# Patient Record
Sex: Male | Born: 1960 | Race: White | Hispanic: No | Marital: Married | Smoking: Current every day smoker
Health system: Southern US, Community
[De-identification: ages and names within clinical notes are randomized; demographics above are authoritative.]

## PROBLEM LIST (undated history)

## (undated) DIAGNOSIS — C4490 Unspecified malignant neoplasm of skin, unspecified: Secondary | ICD-10-CM

## (undated) DIAGNOSIS — J449 Chronic obstructive pulmonary disease, unspecified: Secondary | ICD-10-CM

## (undated) DIAGNOSIS — K449 Diaphragmatic hernia without obstruction or gangrene: Secondary | ICD-10-CM

## (undated) HISTORY — PX: ROTATOR CUFF REPAIR: SHX139

## (undated) HISTORY — PX: APPENDECTOMY: SHX54

## (undated) HISTORY — PX: KNEE ARTHROSCOPY: SUR90

## (undated) HISTORY — PX: SKIN CANCER EXCISION: SHX779

---

## 2011-02-19 ENCOUNTER — Ambulatory Visit: Payer: Self-pay

## 2012-01-13 ENCOUNTER — Encounter (HOSPITAL_COMMUNITY): Payer: Self-pay | Admitting: Emergency Medicine

## 2012-01-13 ENCOUNTER — Emergency Department (HOSPITAL_COMMUNITY): Payer: Medicare Other

## 2012-01-13 ENCOUNTER — Observation Stay (HOSPITAL_COMMUNITY)
Admission: EM | Admit: 2012-01-13 | Discharge: 2012-01-14 | Disposition: A | Discharge status: Home / Self Care | Payer: Medicare Other | Attending: Emergency Medicine | Admitting: Emergency Medicine

## 2012-01-13 DIAGNOSIS — F17200 Nicotine dependence, unspecified, uncomplicated: Secondary | ICD-10-CM | POA: Insufficient documentation

## 2012-01-13 DIAGNOSIS — R911 Solitary pulmonary nodule: Secondary | ICD-10-CM

## 2012-01-13 DIAGNOSIS — R42 Dizziness and giddiness: Secondary | ICD-10-CM | POA: Insufficient documentation

## 2012-01-13 DIAGNOSIS — J4489 Other specified chronic obstructive pulmonary disease: Secondary | ICD-10-CM | POA: Insufficient documentation

## 2012-01-13 DIAGNOSIS — K089 Disorder of teeth and supporting structures, unspecified: Secondary | ICD-10-CM | POA: Insufficient documentation

## 2012-01-13 DIAGNOSIS — R0602 Shortness of breath: Secondary | ICD-10-CM | POA: Insufficient documentation

## 2012-01-13 DIAGNOSIS — K0889 Other specified disorders of teeth and supporting structures: Secondary | ICD-10-CM

## 2012-01-13 DIAGNOSIS — J449 Chronic obstructive pulmonary disease, unspecified: Secondary | ICD-10-CM

## 2012-01-13 DIAGNOSIS — R112 Nausea with vomiting, unspecified: Secondary | ICD-10-CM | POA: Insufficient documentation

## 2012-01-13 DIAGNOSIS — R079 Chest pain, unspecified: Principal | ICD-10-CM

## 2012-01-13 DIAGNOSIS — R61 Generalized hyperhidrosis: Secondary | ICD-10-CM | POA: Insufficient documentation

## 2012-01-13 HISTORY — DX: Unspecified malignant neoplasm of skin, unspecified: C44.90

## 2012-01-13 HISTORY — DX: Diaphragmatic hernia without obstruction or gangrene: K44.9

## 2012-01-13 HISTORY — DX: Chronic obstructive pulmonary disease, unspecified: J44.9

## 2012-01-13 LAB — CBC
Hemoglobin: 17.1 g/dL — ABNORMAL HIGH (ref 13.0–17.0)
MCH: 31.1 pg (ref 26.0–34.0)
MCV: 88 fL (ref 78.0–100.0)
Platelets: 250 10*3/uL (ref 150–400)
RBC: 5.49 MIL/uL (ref 4.22–5.81)
WBC: 18 10*3/uL — ABNORMAL HIGH (ref 4.0–10.5)

## 2012-01-13 LAB — POCT I-STAT TROPONIN I
Troponin i, poc: 0 ng/mL (ref 0.00–0.08)
Troponin i, poc: 0 ng/mL (ref 0.00–0.08)

## 2012-01-13 LAB — BASIC METABOLIC PANEL
CO2: 23 mEq/L (ref 19–32)
Calcium: 9.2 mg/dL (ref 8.4–10.5)
Chloride: 103 mEq/L (ref 96–112)
Creatinine, Ser: 0.76 mg/dL (ref 0.50–1.35)
Glucose, Bld: 92 mg/dL (ref 70–99)
Sodium: 136 mEq/L (ref 135–145)

## 2012-01-13 MED ORDER — NITROGLYCERIN 0.4 MG SL SUBL
0.4000 mg | SUBLINGUAL_TABLET | SUBLINGUAL | Status: DC | PRN
  Administered 2012-01-13: 0.4 mg via SUBLINGUAL

## 2012-01-13 MED ORDER — DIAZEPAM 5 MG PO TABS: 5.0000 mg | ORAL_TABLET | Freq: Every evening | ORAL | Status: DC | PRN

## 2012-01-13 MED ORDER — PENICILLIN V POTASSIUM 250 MG PO TABS
250.0000 mg | ORAL_TABLET | Freq: Once | ORAL | Status: AC
  Administered 2012-01-13: 250 mg via ORAL
  Filled 2012-01-13: qty 1

## 2012-01-13 MED ORDER — OXYCODONE-ACETAMINOPHEN 5-325 MG PO TABS
ORAL_TABLET | ORAL | Status: AC
  Filled 2012-01-13: qty 1

## 2012-01-13 MED ORDER — OXYCODONE-ACETAMINOPHEN 5-325 MG PO TABS
1.0000 | ORAL_TABLET | Freq: Once | ORAL | Status: AC
  Administered 2012-01-13: 1 via ORAL

## 2012-01-13 MED ORDER — ALBUTEROL SULFATE HFA 108 (90 BASE) MCG/ACT IN AERS: 2.0000 | INHALATION_SPRAY | Freq: Four times a day (QID) | RESPIRATORY_TRACT | Status: DC | PRN

## 2012-01-13 MED ORDER — OXYCODONE-ACETAMINOPHEN 5-325 MG PO TABS
1.0000 | ORAL_TABLET | Freq: Once | ORAL | Status: AC
  Administered 2012-01-13: 1 via ORAL
  Filled 2012-01-13: qty 1

## 2012-01-13 MED ORDER — ACETAMINOPHEN 325 MG PO TABS
650.0000 mg | ORAL_TABLET | Freq: Once | ORAL | Status: AC
  Administered 2012-01-13: 650 mg via ORAL
  Filled 2012-01-13: qty 2

## 2012-01-13 MED ORDER — CYCLOBENZAPRINE HCL 10 MG PO TABS: 10.0000 mg | ORAL_TABLET | Freq: Every evening | ORAL | Status: DC | PRN

## 2012-01-13 MED ORDER — ASPIRIN 81 MG PO CHEW
324.0000 mg | CHEWABLE_TABLET | Freq: Once | ORAL | Status: AC
  Administered 2012-01-13: 324 mg via ORAL
  Filled 2012-01-13: qty 4

## 2012-01-13 MED ORDER — PANTOPRAZOLE SODIUM 40 MG PO TBEC
40.0000 mg | DELAYED_RELEASE_TABLET | Freq: Two times a day (BID) | ORAL | Status: DC
  Administered 2012-01-13: 40 mg via ORAL
  Filled 2012-01-13: qty 1

## 2012-01-13 NOTE — ED Provider Notes (Signed)
Medical screening examination/treatment/procedure(s) were performed by non-physician practitioner and as supervising physician I was immediately available for consultation/collaboration.  Ethelda Chick, MD 01/13/12 1630

## 2012-01-13 NOTE — ED Provider Notes (Signed)
History     CSN: [id number]  Arrival date & time 01/13/12  1304   First MD Initiated Contact with Patient 01/13/12 1332      Chief Complaint  Patient presents with  . Chest Pain  . Dental Pain    (Consider location/radiation/quality/duration/timing/severity/associated sxs/prior treatment) HPI  Pt presents to the ED because of on again off again for the past 3 days. He has been dealing with chest pains for two years and had a stress test done in October of 2012 which the patient states was normal. He continues to have pressure and it relieved with nitro. The patient has risk factors such as smoking and COPD. He endorses feeling shortness of breath, N/V, diaphoresis and dizziness with the pain that has been waxing and wain ing. He also complains of dental pain for which he is taking pain medications for at home already. He states that the medications are not working. The patient is in NAD and VSS at this time.  Filed Vitals:   01/13/12 1539  BP: 122/90  Pulse: 56  Temp:   Resp: 14     Past Medical History  Diagnosis Date  . Skin cancer   . COPD (chronic obstructive pulmonary disease)   . Hiatal hernia     Past Surgical History  Procedure Date  . Skin cancer excision     History reviewed. No pertinent family history.  History  Substance Use Topics  . Smoking status: Current Everyday Smoker  . Smokeless tobacco: Not on file  . Alcohol Use: Yes     occasional      Review of Systems   HEENT: denies blurry vision or change in hearing PULMONARY: Denies difficulty breathing and SOB CARDIAC: denies heart palpitations MUSCULOSKELETAL:  denies being unable to ambulate ABDOMEN AL: denies abdominal pain GU: denies loss of bowel or urinary control NEURO: denies numbness and tingling in extremities SKIN: no new rashes PSYCH: patient denies anxiety or depression. NECK: Pt denies having neck pain     Allergies  Morphine and related  Home Medications   Current  Outpatient Rx  Name Route Sig Dispense Refill  . ALBUTEROL SULFATE HFA 108 (90 BASE) MCG/ACT IN AERS Inhalation Inhale 2 puffs into the lungs every 6 (six) hours as needed. For shortness of breath    . BECLOMETHASONE DIPROPIONATE 80 MCG/ACT IN AERS Inhalation Inhale 1 puff into the lungs 2 (two) times daily.    . CYCLOBENZAPRINE HCL 10 MG PO TABS Oral Take 10 mg by mouth at bedtime as needed. For pain    . DIAZEPAM 5 MG PO TABS Oral Take 5 mg by mouth at bedtime as needed. For sleep    . HYDROCODONE-ACETAMINOPHEN 10-500 MG PO TABS Oral Take 1 tablet by mouth 3 (three) times daily as needed. For pain    . PANTOPRAZOLE SODIUM 40 MG PO TBEC Oral Take 40 mg by mouth 2 (two) times daily.      BP 112/81  Pulse 53  Temp 98.3 F (36.8 C) (Oral)  Resp 20  SpO2 98%  Physical Exam  Nursing note and vitals reviewed. Constitutional: He appears well-developed and well-nourished. No distress.  HENT:  Head: Normocephalic and atraumatic.  Mouth/Throat: Dental caries present.  Eyes: Conjunctivae and EOM are normal. Pupils are equal, round, and reactive to light.  Neck: Normal range of motion. Neck supple.  Cardiovascular: Normal rate and regular rhythm.   Pulmonary/Chest: Effort normal and breath sounds normal. He has no wheezes. He has no rales.  Abdominal: Soft.  Neurological: He is alert.  Skin: Skin is warm and dry.    ED Course  Procedures (including critical care time)  Labs Reviewed  CBC - Abnormal; Notable for the following:    WBC 18.0 (*)     Hemoglobin 17.1 (*)     All other components within normal limits  BASIC METABOLIC PANEL  POCT I-STAT TROPONIN I   Dg Chest 2 View  01/13/2012  *RADIOLOGY REPORT*  Clinical Data: Shortness of breath.  Episode of mid chest pain approximately 2 weeks ago.  Smoker.  CHEST - 2 VIEW  Comparison: Two-view chest x-ray 11/12/2006 [facility].  Findings: Cardiomediastinal silhouette unremarkable, unchanged. Mildly prominent bronchovascular  markings diffusely, unchanged. Lungs otherwise clear.  No pleural effusions.  Mild degenerative changes involving the thoracic spine.  IMPRESSION: Stable mild changes of chronic bronchitis and/or asthma.  No acute cardiopulmonary disease.  Original Report Authenticated By: Arnell Sieving, M.D.     No diagnosis found.    MDM   Date: 01/13/2012  Rate: 65  Rhythm: normal sinus rhythm  QRS Axis: normal  Intervals: normal  ST/T Wave abnormalities: normal  Conduction Disutrbances:right bundle branch block  Narrative Interpretation:   Old EKG Reviewed: none available  First troponin negative. Pt states that he had normal stress test at [facility] approx 1 year ago. Per results from [facility] hill that I had faxed over shows that his test was abnormal due to being incomplete   Due to patients continuing pain over the past 2 years and abnormal stress test. Dr. Karma Ganja and I feel the patient is a good candidate for the chest pain protocol.  I have discussed patient with Grant Fontana, PA-C who is working in the CDU and I have spoken with patients family who are all agreeable to the plan.         Dorthula Matas, PA 01/13/12 [phone]

## 2012-01-13 NOTE — ED Notes (Signed)
bmi 24.2

## 2012-01-13 NOTE — ED Provider Notes (Signed)
4:00 PM Care assumed of patient in the CDU on the chest pain protocol (CT). Patient presented with complaint of intermittent midsternal chest pain since Sunday which was accompanied by shortness of breath, nausea and vomiting, diaphoresis, and dizziness. He is currently chest pain-free. He had a stress test at [facility] which was "abnormal" but he has had no further workup for this. The stress test was several months ago.  Patient is also complaining of dental pain. He appears to have a cavity to the right upper premolar. No obvious evidence of dental abscess. No trismus. He will need to be referred to dentistry on discharge. He was given Percocet.  9:30 PM Pt rechecked - remains pain free - no complaints at this time.  12:00 AM Pt sleeping. Pt signed out to Dr. Oletta Lamas, Pod B overnight MD.  Grant Fontana, PA-C 01/14/12 0001

## 2012-01-13 NOTE — ED Notes (Signed)
T. Neva Seat [facility] made aware that the patient is complaining of a headache after 1 NTG SL.

## 2012-01-13 NOTE — ED Notes (Signed)
Pt c/o midsternal chest pain onset Sunday with shortness of breath, N/v, diaphoresis and dizziness. Pt also reports left fingers feeling numb. Pt c/o right upper toothache onset June 16th.

## 2012-01-14 ENCOUNTER — Other Ambulatory Visit (HOSPITAL_COMMUNITY): Payer: Self-pay

## 2012-01-14 ENCOUNTER — Observation Stay (HOSPITAL_COMMUNITY): Payer: Medicare Other

## 2012-01-14 MED ORDER — PENICILLIN V POTASSIUM 500 MG PO TABS: 500.0000 mg | ORAL_TABLET | Freq: Three times a day (TID) | ORAL | Status: DC

## 2012-01-14 MED ORDER — PENICILLIN V POTASSIUM 250 MG PO TABS
250.0000 mg | ORAL_TABLET | Freq: Once | ORAL | Status: AC
  Administered 2012-01-14: 250 mg via ORAL
  Filled 2012-01-14: qty 1

## 2012-01-14 MED ORDER — HYDROCODONE-ACETAMINOPHEN 5-500 MG PO TABS: 1.0000 | ORAL_TABLET | Freq: Four times a day (QID) | ORAL | Status: AC | PRN

## 2012-01-14 MED ORDER — NITROGLYCERIN 0.4 MG SL SUBL
SUBLINGUAL_TABLET | SUBLINGUAL | Status: AC
  Administered 2012-01-14: 0.4 mg via SUBLINGUAL
  Filled 2012-01-14: qty 25

## 2012-01-14 MED ORDER — NITROGLYCERIN 0.4 MG SL SUBL
0.4000 mg | SUBLINGUAL_TABLET | Freq: Once | SUBLINGUAL | Status: AC
  Administered 2012-01-14: 0.4 mg via SUBLINGUAL

## 2012-01-14 MED ORDER — PENICILLIN V POTASSIUM 500 MG PO TABS: 500.0000 mg | ORAL_TABLET | Freq: Three times a day (TID) | ORAL | Status: AC

## 2012-01-14 MED ORDER — METOPROLOL TARTRATE 1 MG/ML IV SOLN
INTRAVENOUS | Status: AC
  Filled 2012-01-14: qty 5

## 2012-01-14 MED ORDER — METOPROLOL TARTRATE 1 MG/ML IV SOLN: 5.0000 mg | Freq: Once | INTRAVENOUS | Status: DC

## 2012-01-14 MED ORDER — HYDROCODONE-ACETAMINOPHEN 5-500 MG PO TABS: 1.0000 | ORAL_TABLET | Freq: Four times a day (QID) | ORAL | Status: DC | PRN

## 2012-01-14 MED ORDER — IOHEXOL 350 MG/ML SOLN
80.0000 mL | Freq: Once | INTRAVENOUS | Status: AC | PRN
  Administered 2012-01-14: 80 mL via INTRAVENOUS

## 2012-01-14 MED ORDER — METOPROLOL TARTRATE 1 MG/ML IV SOLN
5.0000 mg | Freq: Once | INTRAVENOUS | Status: AC
  Administered 2012-01-14: 5 mg via INTRAVENOUS
  Filled 2012-01-14: qty 5

## 2012-01-14 MED ORDER — OXYCODONE-ACETAMINOPHEN 5-325 MG PO TABS
1.0000 | ORAL_TABLET | Freq: Once | ORAL | Status: AC
  Administered 2012-01-14: 1 via ORAL
  Filled 2012-01-14: qty 1

## 2012-01-14 NOTE — ED Provider Notes (Addendum)
Patient on CP protocol placed by Dr. Oletta Lamas overnight. Continues to deny CP and waiting coronary CT this morning. Due to leave CDU soon for procedure. Patient complaining of ongoing mild to moderate dental pain associated with dental cary. Will treat dental pain. Afebrile and no facial swelling. dispo pending imaging.   Drucie Opitz, [place] 01/14/12 4098  Patient has a PCP in [place] to follow up with to have OP CT chest non contrast scheduled in 6 months for f/u of pulmonary nodules. No cardiac origin of chest pain with calcium score 0 and no evidence of CAD.   [place], [place] 01/14/12 [phone]

## 2012-01-14 NOTE — Progress Notes (Signed)
Observation review is complete. 

## 2012-01-14 NOTE — ED Notes (Signed)
Patient sitting up in bed. Appears to be in pain. When questioned states his face is hurting. Patient offered pain meds. States he is alright. Advised of need to inform staff of need for pain medication. Will continue to monitor patient.

## 2012-01-14 NOTE — ED Provider Notes (Signed)
Medical screening examination/treatment/procedure(s) were performed by non-physician practitioner and as supervising physician I was immediately available for consultation/collaboration.  Cheri Guppy, MD 01/14/12 [phone]

## 2012-01-14 NOTE — ED Provider Notes (Signed)
Medical screening examination/treatment/procedure(s) were performed by non-physician practitioner and as supervising physician I was immediately available for consultation/collaboration.  Kyrstyn Greear, MD 01/14/12 1127 

## 2012-01-16 NOTE — ED Provider Notes (Signed)
Medical screening examination/treatment/procedure(s) were performed by non-physician practitioner and as supervising physician I was immediately available for consultation/collaboration.  Ethelda Chick, MD 01/16/12 [phone]

## 2014-01-07 IMAGING — CR DG CHEST 2V
2 series · 2 of 2 positions shown · non-contrast
Comparison: Two-view chest x-ray 11/12/2006 [facility].

CLINICAL DATA: Shortness of breath.  Episode of mid chest pain
approximately 2 weeks ago.  Smoker.

CHEST - 2 VIEW

[w chest pa]
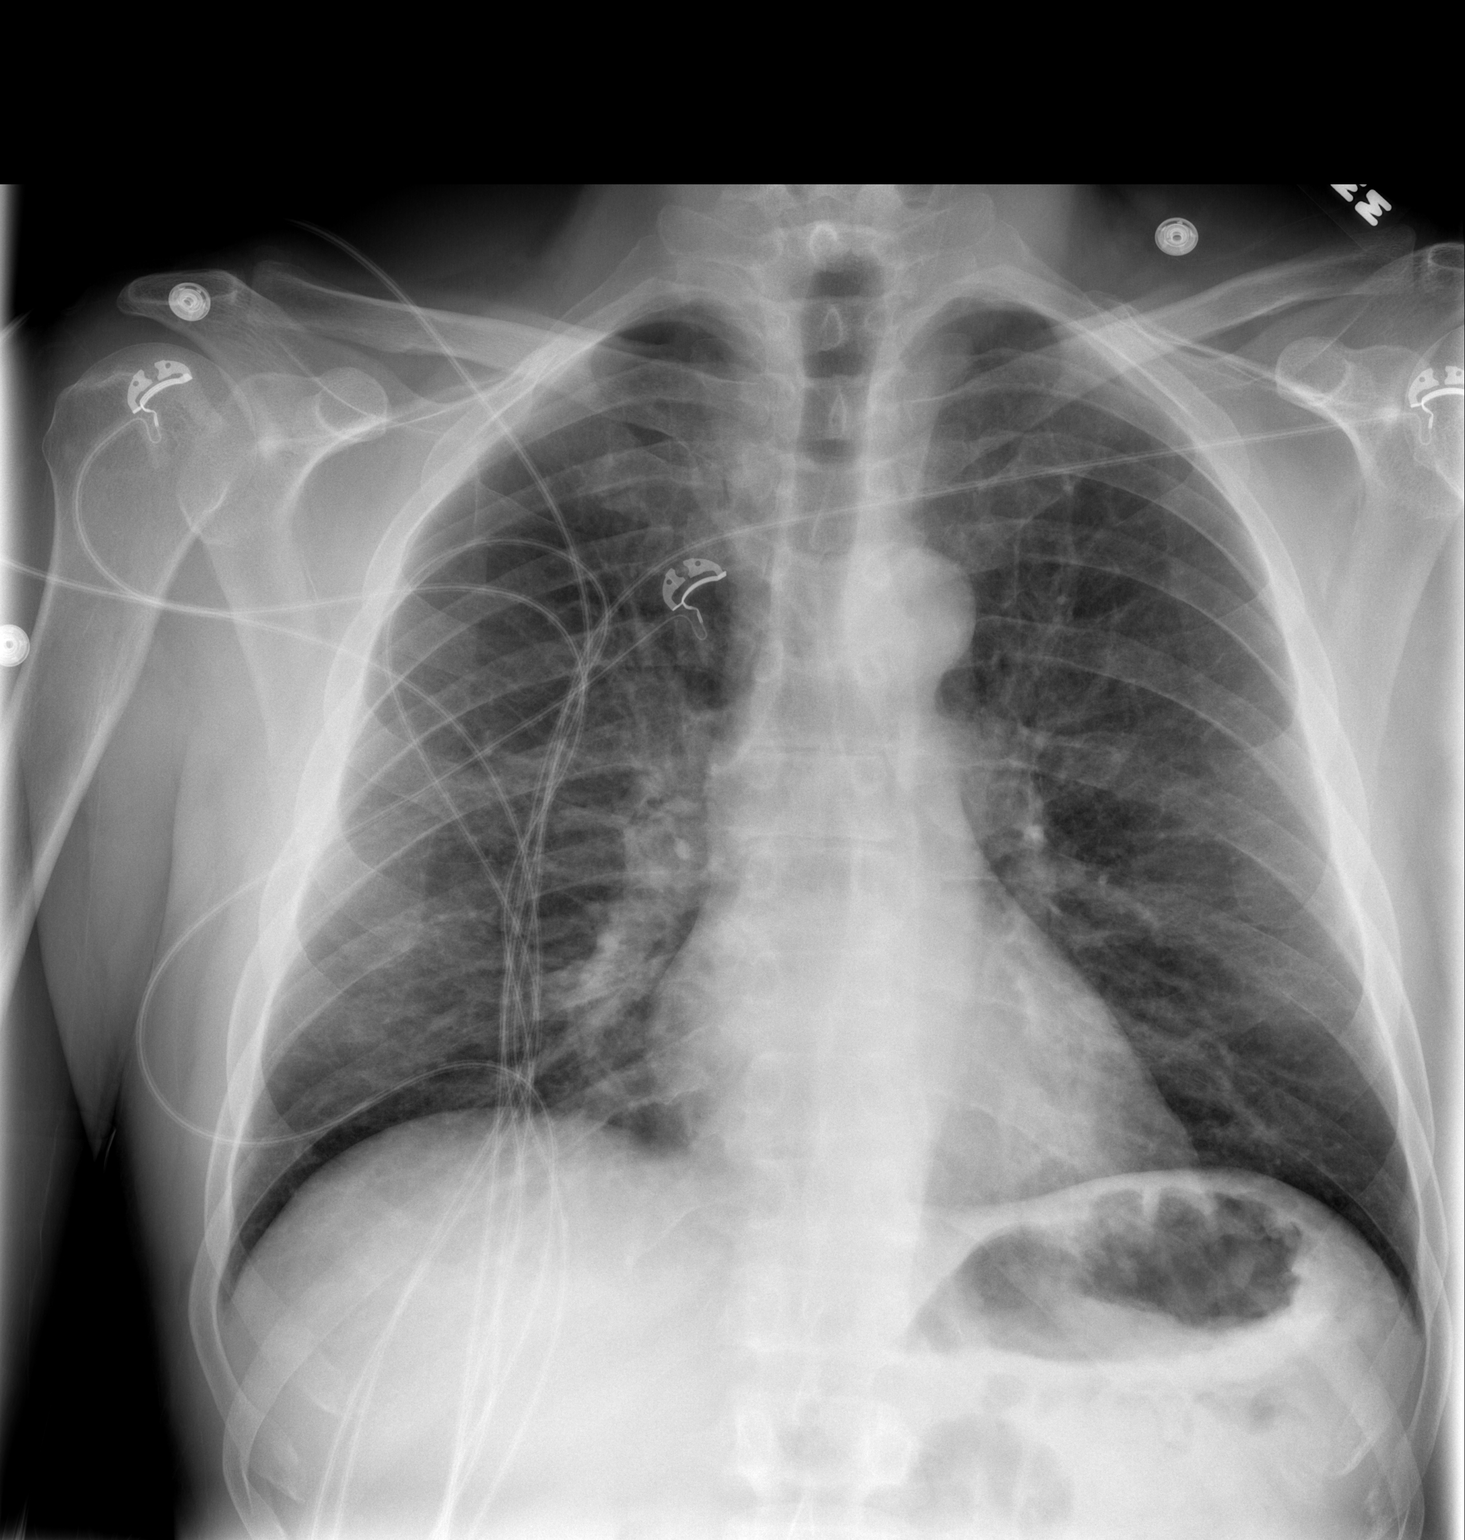

[w chest lat]
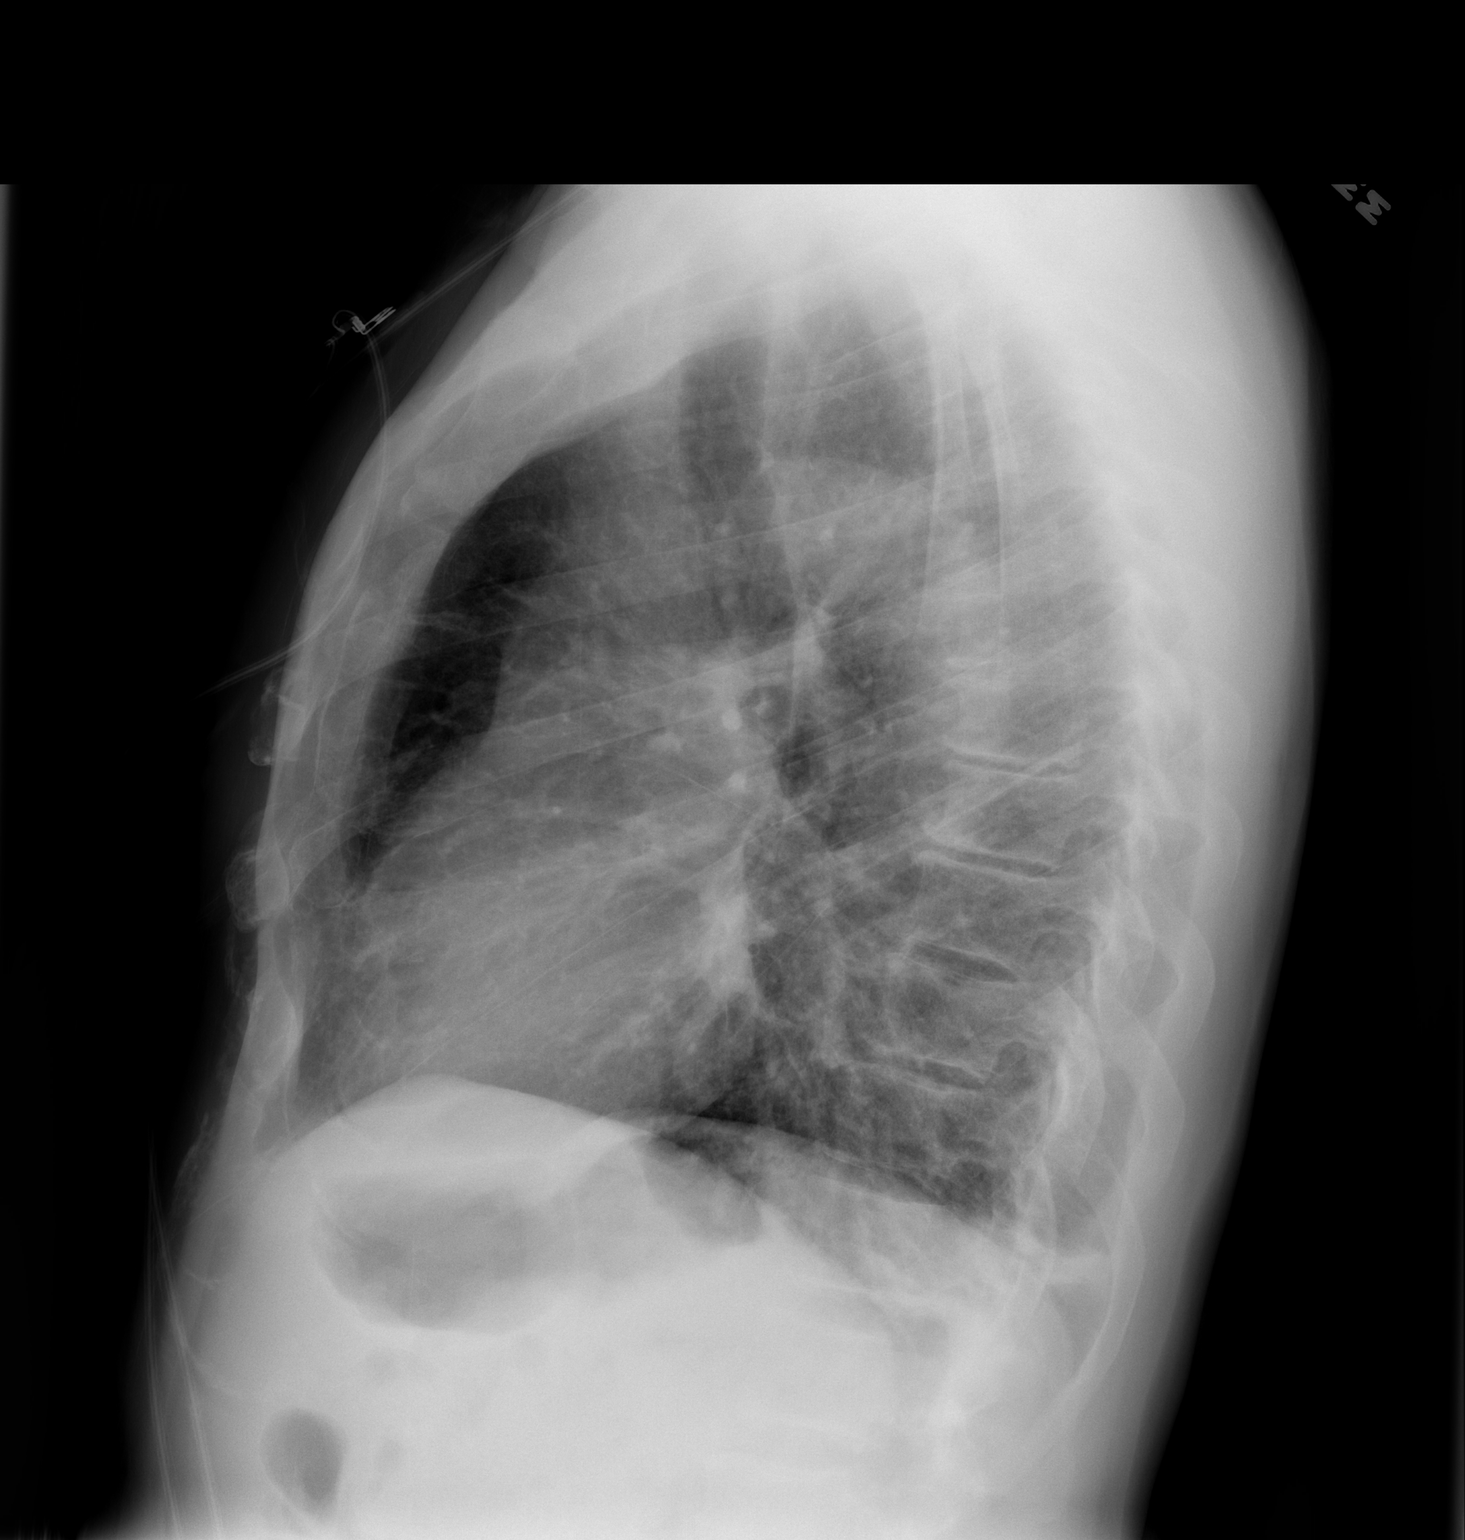

[2 of 2 positions shown; findings below may reference images not displayed]

FINDINGS: Cardiomediastinal silhouette unremarkable, unchanged.
Mildly prominent bronchovascular markings diffusely, unchanged.
Lungs otherwise clear.  No pleural effusions.  Mild degenerative
changes involving the thoracic spine.
IMPRESSION: Stable mild changes of chronic bronchitis and/or asthma.  No acute
cardiopulmonary disease.

## 2015-04-20 DIAGNOSIS — D72829 Elevated white blood cell count, unspecified: Secondary | ICD-10-CM | POA: Diagnosis not present

## 2015-04-20 DIAGNOSIS — Z720 Tobacco use: Secondary | ICD-10-CM | POA: Diagnosis not present

## 2016-11-12 ENCOUNTER — Ambulatory Visit (INDEPENDENT_AMBULATORY_CARE_PROVIDER_SITE_OTHER): Payer: Self-pay | Admitting: Orthopaedic Surgery

## 2016-11-18 ENCOUNTER — Ambulatory Visit (INDEPENDENT_AMBULATORY_CARE_PROVIDER_SITE_OTHER): Payer: Medicare HMO | Admitting: Orthopaedic Surgery

## 2016-11-18 ENCOUNTER — Encounter (INDEPENDENT_AMBULATORY_CARE_PROVIDER_SITE_OTHER): Payer: Self-pay | Admitting: Orthopaedic Surgery

## 2016-11-18 DIAGNOSIS — S83241A Other tear of medial meniscus, current injury, right knee, initial encounter: Secondary | ICD-10-CM | POA: Diagnosis not present

## 2016-11-18 NOTE — Progress Notes (Signed)
Office Visit Note   Patient: Jeffery Parks           Date of Birth: Dec 10, 1960           MRN: [id number] Visit Date: 11/18/2016              Requested by: Jimmy Picket, DO [address] [place] [address] PCP: Lauree Chandler, MD   Assessment & Plan: Visit Diagnoses:  1. Acute medial meniscus tear, right, initial encounter     Plan: I reviewed the MRI right knee which does show a complex tear of the posterior horn of the medial meniscus. 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 Recommendation is for arthroscopic debridement of the meniscal tear. I did offer him a cortisone injection to see if this will calm things down but he declined. We discussed the risks benefits alternatives to surgery he understands and wishes to proceed.  Follow-Up Instructions: Return if symptoms worsen or fail to improve.   Orders:  No orders of the defined types were placed in this encounter.  No orders of the defined types were placed in this encounter.     Procedures: No procedures performed   Clinical Data: No additional findings.   Subjective: Chief Complaint  Patient presents with  . Right Knee - Pain    Patient is a 56 year old gentleman who has had right knee pain for about 4 weeks status post fall in which he twisted his knee. He states that his entire knee hurts. He's been taking oxycodone 4 times a day. He endorses mechanical symptoms. He is also improving due to the pain. The pain does not radiate.    Review of Systems  Constitutional: Negative.   All other systems reviewed and are negative.    Objective: Vital Signs: Ht 5' 10.5" (1.791 m)   Wt 180 lb (81.6 kg)   BMI 25.46 kg/m   Physical Exam  Constitutional: He is oriented to person, place, and time. He appears well-developed and well-nourished.  HENT:  Head: Normocephalic and atraumatic.  Eyes: Pupils are equal, round, and reactive to  light.  Neck: Neck supple.  Pulmonary/Chest: Effort normal.  Abdominal: Soft.  Musculoskeletal: Normal range of motion.  Neurological: He is alert and oriented to person, place, and time.  Skin: Skin is warm.  Psychiatric: He has a normal mood and affect. His behavior is normal. Judgment and thought content normal.  Nursing note and vitals reviewed.   Ortho Exam Right knee exam shows no joint effusion. Motion of the knee is painful. McMurray testing elicits pain on the medial joint line. He does have medial joint line tenderness. Specialty Comments:  No specialty comments available.  Imaging: No results found.   PMFS History: Patient Active Problem List   Diagnosis Date Noted  . Acute medial meniscus tear, right, initial encounter 11/18/2016   Past Medical History:  Diagnosis Date  . COPD (chronic obstructive pulmonary disease) ([place])   . Hiatal hernia   . Skin cancer     History reviewed. No pertinent family history.  Past Surgical History:  Procedure Laterality Date  . SKIN CANCER EXCISION     Social History   Occupational History  . Not on file.   Social History Main Topics  . Smoking status: Current Every Day Smoker  . Smokeless tobacco: Never Used  . Alcohol use Yes     Comment: occasional  . Drug use: No  .  Sexual activity: Not on file

## 2017-01-02 DIAGNOSIS — S83241D Other tear of medial meniscus, current injury, right knee, subsequent encounter: Secondary | ICD-10-CM | POA: Diagnosis not present

## 2017-01-02 DIAGNOSIS — M659 Synovitis and tenosynovitis, unspecified: Secondary | ICD-10-CM

## 2017-01-04 ENCOUNTER — Encounter (INDEPENDENT_AMBULATORY_CARE_PROVIDER_SITE_OTHER): Payer: Self-pay | Admitting: Orthopaedic Surgery

## 2017-01-04 DIAGNOSIS — M659 Synovitis and tenosynovitis, unspecified: Secondary | ICD-10-CM | POA: Insufficient documentation

## 2017-01-06 DIAGNOSIS — M5136 Other intervertebral disc degeneration, lumbar region: Secondary | ICD-10-CM | POA: Insufficient documentation

## 2017-01-06 DIAGNOSIS — Z8709 Personal history of other diseases of the respiratory system: Secondary | ICD-10-CM | POA: Insufficient documentation

## 2017-01-06 DIAGNOSIS — M2550 Pain in unspecified joint: Secondary | ICD-10-CM | POA: Insufficient documentation

## 2017-01-06 DIAGNOSIS — Z8719 Personal history of other diseases of the digestive system: Secondary | ICD-10-CM | POA: Insufficient documentation

## 2017-01-06 DIAGNOSIS — F17200 Nicotine dependence, unspecified, uncomplicated: Secondary | ICD-10-CM | POA: Insufficient documentation

## 2017-01-06 DIAGNOSIS — M5030 Other cervical disc degeneration, unspecified cervical region: Secondary | ICD-10-CM | POA: Insufficient documentation

## 2017-01-06 DIAGNOSIS — R768 Other specified abnormal immunological findings in serum: Secondary | ICD-10-CM | POA: Insufficient documentation

## 2017-01-06 DIAGNOSIS — Z85828 Personal history of other malignant neoplasm of skin: Secondary | ICD-10-CM | POA: Insufficient documentation

## 2017-01-06 NOTE — Progress Notes (Signed)
Office Visit Note  Patient: Jeffery Parks             Date of Birth: 1961-03-19           MRN: [id number]             PCP: Lauree Chandler, MD Referring: Welford Roche, NP Visit Date: 01/08/2017 Occupation: @GUAROCC @    Subjective:  Pain in multiple joints and positive ANA   History of Present Illness: Jeffery Parks is a 56 y.o. male seen in consultation per request of his PCP. According to patient his symptoms started about 7 years ago with increased pain and discomfort in his multiple joints. He states the pain was mostly in his hands and his knee joints. He was given muscle relaxers for a while which did not help and were discontinued. He was also given pain medications which did not control his pain symptoms. He states recently he is been having increased joint pain and discomfort. He had labs done by his PCP which were positive for ANA. He complains of pain in his hands, elbows, shoulders, knee joints and ankle joints. He states the pain is worse in the morning. He has noticed some swelling in his hands and his ankles. He is also concerned about possible psoriasis on  plantar surface of his feet. He uses Kenalog for that. This positive history of psoriasis in his brother. He had right knee joint torn meniscus tear repair last week.  Activities of Daily Living:  Patient reports morning stiffness for 30 minutes.   Patient Reports nocturnal pain.  Difficulty dressing/grooming: Denies Difficulty climbing stairs: Reports Difficulty getting out of chair: Reports Difficulty using hands for taps, buttons, cutlery, and/or writing: Reports   Review of Systems  Constitutional: Positive for fatigue. Negative for night sweats and weakness ( ).  HENT: Negative for mouth sores, mouth dryness and nose dryness.   Eyes: Negative for redness and dryness.  Respiratory: Positive for shortness of breath. Negative for difficulty breathing.        COPD  Cardiovascular: Negative for chest  pain, palpitations, hypertension, irregular heartbeat and swelling in legs/feet.  Gastrointestinal: Negative for constipation and diarrhea.  Endocrine: Negative for increased urination.  Musculoskeletal: Positive for arthralgias, joint pain, myalgias, morning stiffness and myalgias. Negative for joint swelling, muscle weakness and muscle tenderness.  Skin: Negative for color change, rash, hair loss, nodules/bumps, skin tightness, ulcers and sensitivity to sunlight.  Allergic/Immunologic: Negative for susceptible to infections.  Neurological: Negative for dizziness, fainting, memory loss and night sweats.  Hematological: Negative for swollen glands.  Psychiatric/Behavioral: Positive for depressed mood and sleep disturbance. The patient is nervous/anxious.     PMFS History:  Patient Active Problem List   Diagnosis Date Noted  . S/P arthroscopic surgery of right knee 01/08/2017  . ANA positive 01/06/2017  . Multiple joint pain 01/06/2017  . Smoker 01/06/2017  . History of COPD 01/06/2017  . History of skin cancer 01/06/2017  . DDD (degenerative disc disease), cervical 01/06/2017  . DDD (degenerative disc disease), lumbar 01/06/2017  . History of gastroesophageal reflux (GERD) 01/06/2017  . Synovitis of right knee 01/04/2017  . Acute medial meniscus tear, right, initial encounter 11/18/2016    Past Medical History:  Diagnosis Date  . COPD (chronic obstructive pulmonary disease) ([place])   . Hiatal hernia   . Skin cancer     History reviewed. No pertinent family history. Past Surgical History:  Procedure Laterality Date  . APPENDECTOMY    . KNEE  ARTHROSCOPY    . ROTATOR CUFF REPAIR Right   . SKIN CANCER EXCISION     Social History   Social History Narrative  . No narrative on file     Objective: Vital Signs: BP 120/64 (BP Location: Right Arm)   Pulse 68   Resp 16   Ht 5\' 11"  (1.803 m)   Wt 156 lb (70.8 kg)   BMI 21.76 kg/m    Physical Exam  Constitutional: He is  oriented to person, place, and time. He appears well-developed and well-nourished.  HENT:  Head: Normocephalic and atraumatic.  Eyes: Conjunctivae and EOM are normal. Pupils are equal, round, and reactive to light.  Neck: Normal range of motion. Neck supple.  Cardiovascular: Normal rate, regular rhythm and normal heart sounds.   Pulmonary/Chest: Effort normal and breath sounds normal.  Abdominal: Soft. Bowel sounds are normal.  Neurological: He is alert and oriented to person, place, and time.  Skin: Skin is warm and dry. Capillary refill takes less than 2 seconds.  Psychiatric: He has a normal mood and affect. His behavior is normal.  Nursing note and vitals reviewed.    Musculoskeletal Exam: C-spine good range of motion thoracic spine good range of motion he discomfort range of motion of his lumbar spine. Shoulder joints elbow joints wrist joint MCPs PIPs DIPs are good range of motion. He has DIP PIP thickening bilaterally no synovitis was noted on examination. Hip joints are good range of motion. His right knee joint was wrapped due to recent arthroscopic surgery. Left knee joint was good range of motion with no warmth swelling or effusion. He has some tenderness across his MTPs and PIP joints. I do not see any synovitis is thickening of PIP/DIP joints.  CDAI Exam: No CDAI exam completed.    Investigation: Findings:  10/02/2016 CMP normal, TSH normal 0.961, Vitamin D normal 44.9,  Uric Acid 5.1  10/15/2016 ANA positive no titer performed RNP elevated 1.1, Smith, SS-A, SS-B, and dsDNA normal, RF negative, Sed Rate normal       Imaging: Xr Foot 2 Views Left  Result Date: 01/08/2017 Minimal PIP/DIP narrowing was noted. Minimal first MTP joint narrowing was noted. No other MTPs showed narrowing or erosive changes no intertarsal joint space narrowing was noted. Impression: Mild osteoarthritis of the foot  Xr Foot 2 Views Right  Result Date: 01/08/2017 Minimal PIP/DIP narrowing was  noted. Minimal first MTP joint narrowing was noted. No other MTPs showed narrowing or erosive changes no intertarsal joint space narrowing was noted. Impression: Mild osteoarthritis of the foot  Xr Hand 2 View Left  Result Date: 01/08/2017 Minimal PIP/DIP narrowing was noted. No MCP joint changes or intercarpal joint changes were noted. No erosive changes were noted. He had subluxation of left first MCP joint and narrowing. 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 No MCP joint changes or intercarpal joint changes were noted. No erosive changes were noted. Impression: Mild osteoarthritis of hand   Speciality Comments: No specialty comments available.    Procedures:  No procedures performed Allergies: Morphine; Gabapentin; Lyrica [pregabalin]; and Morphine and related   Assessment / Plan:     Visit Diagnoses: Pain in both hands -his clinical findings are consistent with osteoarthritis. I do not see any synovitis on examination. I'll obtain following labs to evaluate this further. Plan: XR Hand 2 View Right, XR Hand 2  View Left, x-rays revealed only mild osteoarthritic changes in bilateral hands Cyclic citrul peptide antibody, IgG, [id number] ENA PANEL, C3 and C4  Multiple joint pain: He complains of chronic multiple joint discomfort. No synovitis was noted.  Pain in both feet - Plan: XR Foot 2 Views Right, XR Foot 2 Views Left. X-rays revealed only mild ostial 3 changes and bilateral feet  ANA positive, no titer was given, he has positive RNP. I will obtain following labs to evaluate this further. - Plan: Urinalysis, Routine w reflex microscopic, ANA, Beta-2 glycoprotein antibodies, Cardiolipin antibodies, IgG, IgM, IgA, Lupus anticoagulant panel  Other fatigue - Plan: CBC with Differential/Platelet, COMPLETE METABOLIC PANEL WITH GFR, CK, VITAMIN D 25  Hydroxy (Vit-D Deficiency, Fractures)  S/P arthroscopic surgery of right knee -  meniscal tear repair June 2018  DDD (degenerative disc disease), cervical: Chronic pain: Chronic pain  DDD (degenerative disc disease), lumbar  Smoker: Smoking cessation was discussed.  History of COPD  History of skin cancer - Squamous cell carcinoma in situ  History of gastroesophageal reflux (GERD)    Orders: Orders Placed This Encounter  Procedures  . XR Hand 2 View Right  . XR Hand 2 View Left  . XR Foot 2 Views Right  . XR Foot 2 Views Left  . CBC with Differential/Platelet  . COMPLETE METABOLIC PANEL WITH GFR  . Urinalysis, Routine w reflex microscopic  . ANA  . Cyclic citrul peptide antibody, IgG  . [id number] ENA PANEL  . C3 and C4  . Beta-2 glycoprotein antibodies  . Cardiolipin antibodies, IgG, IgM, IgA  . Lupus anticoagulant panel  . CK  . VITAMIN D 25 Hydroxy (Vit-D Deficiency, Fractures)   No orders of the defined types were placed in this encounter.   Face-to-face time spent with patient was 50 minutes. 50% of time was spent in counseling and coordination of care.  Follow-Up Instructions: Return for Polyarthralgia, positive ANA.   Bo Merino, MD  Note - This record has been created using Editor, commissioning.  Chart creation errors have been sought, but may not always  have been located. Such creation errors do not reflect on  the standard of medical care.

## 2017-01-08 ENCOUNTER — Ambulatory Visit (INDEPENDENT_AMBULATORY_CARE_PROVIDER_SITE_OTHER): Payer: Medicare HMO

## 2017-01-08 ENCOUNTER — Ambulatory Visit (INDEPENDENT_AMBULATORY_CARE_PROVIDER_SITE_OTHER): Payer: Medicare HMO | Admitting: Rheumatology

## 2017-01-08 ENCOUNTER — Encounter: Payer: Self-pay | Admitting: Rheumatology

## 2017-01-08 VITALS — BP 120/64 | HR 68 | Resp 16 | Ht 71.0 in | Wt 156.0 lb

## 2017-01-08 DIAGNOSIS — M79671 Pain in right foot: Secondary | ICD-10-CM

## 2017-01-08 DIAGNOSIS — Z8709 Personal history of other diseases of the respiratory system: Secondary | ICD-10-CM

## 2017-01-08 DIAGNOSIS — M2550 Pain in unspecified joint: Secondary | ICD-10-CM

## 2017-01-08 DIAGNOSIS — M5030 Other cervical disc degeneration, unspecified cervical region: Secondary | ICD-10-CM

## 2017-01-08 DIAGNOSIS — R768 Other specified abnormal immunological findings in serum: Secondary | ICD-10-CM

## 2017-01-08 DIAGNOSIS — R5383 Other fatigue: Secondary | ICD-10-CM

## 2017-01-08 DIAGNOSIS — M5136 Other intervertebral disc degeneration, lumbar region: Secondary | ICD-10-CM

## 2017-01-08 DIAGNOSIS — M79641 Pain in right hand: Secondary | ICD-10-CM

## 2017-01-08 DIAGNOSIS — M79642 Pain in left hand: Secondary | ICD-10-CM

## 2017-01-08 DIAGNOSIS — M79672 Pain in left foot: Secondary | ICD-10-CM

## 2017-01-08 DIAGNOSIS — F17200 Nicotine dependence, unspecified, uncomplicated: Secondary | ICD-10-CM

## 2017-01-08 DIAGNOSIS — Z85828 Personal history of other malignant neoplasm of skin: Secondary | ICD-10-CM

## 2017-01-08 DIAGNOSIS — Z8719 Personal history of other diseases of the digestive system: Secondary | ICD-10-CM

## 2017-01-08 DIAGNOSIS — Z9889 Other specified postprocedural states: Secondary | ICD-10-CM | POA: Diagnosis not present

## 2017-01-09 LAB — CBC WITH DIFFERENTIAL/PLATELET
Basophils Absolute: 138 cells/uL (ref 0–200)
Basophils Relative: 1 %
Eosinophils Absolute: 552 cells/uL — ABNORMAL HIGH (ref 15–500)
Eosinophils Relative: 4 %
HCT: 45.7 % (ref 38.5–50.0)
HEMOGLOBIN: 14.8 g/dL (ref 13.2–17.1)
LYMPHS ABS: 3864 {cells}/uL (ref 850–3900)
LYMPHS PCT: 28 %
MCH: 29.4 pg (ref 27.0–33.0)
MCHC: 32.4 g/dL (ref 32.0–36.0)
MCV: 90.9 fL (ref 80.0–100.0)
MONO ABS: 1242 {cells}/uL — AB (ref 200–950)
MPV: 11.1 fL (ref 7.5–12.5)
Monocytes Relative: 9 %
NEUTROS PCT: 58 %
Neutro Abs: 8004 cells/uL — ABNORMAL HIGH (ref 1500–7800)
Platelets: 385 10*3/uL (ref 140–400)
RBC: 5.03 MIL/uL (ref 4.20–5.80)
RDW: 14.7 % (ref 11.0–15.0)
WBC: 13.8 10*3/uL — AB (ref 3.8–10.8)

## 2017-01-09 LAB — ANA: Anti Nuclear Antibody(ANA): NEGATIVE

## 2017-01-09 LAB — COMPLETE METABOLIC PANEL WITH GFR
ALBUMIN: 4.2 g/dL (ref 3.6–5.1)
ALT: 6 U/L — AB (ref 9–46)
AST: 12 U/L (ref 10–35)
Alkaline Phosphatase: 91 U/L (ref 40–115)
BUN: 10 mg/dL (ref 7–25)
CALCIUM: 9.6 mg/dL (ref 8.6–10.3)
CHLORIDE: 100 mmol/L (ref 98–110)
CO2: 20 mmol/L (ref 20–31)
CREATININE: 0.76 mg/dL (ref 0.70–1.33)
GFR, Est Non African American: >89 mL/min (ref >=60)
Glucose, Bld: 85 mg/dL (ref 65–99)
Potassium: 5.1 mmol/L (ref 3.5–5.3)
Sodium: 138 mmol/L (ref 135–146)
Total Bilirubin: 0.4 mg/dL (ref 0.2–1.2)
Total Protein: 7.4 g/dL (ref 6.1–8.1)

## 2017-01-09 LAB — URINALYSIS, ROUTINE W REFLEX MICROSCOPIC
BILIRUBIN URINE: NEGATIVE
Glucose, UA: NEGATIVE
Hgb urine dipstick: NEGATIVE
Ketones, ur: NEGATIVE
Leukocytes, UA: NEGATIVE
NITRITE: NEGATIVE
Protein, ur: NEGATIVE
Specific Gravity, Urine: 1.017 (ref 1.001–1.035)
pH: 6 (ref 5.0–8.0)

## 2017-01-09 LAB — CP5000020 ENA PANEL
ENA SM Ab Ser-aCnc: <1
RIBONUCLEIC PROTEIN(ENA) ANTIBODY, IGG: POSITIVE — AB
SCLERODERMA (SCL-70) (ENA) ANTIBODY, IGG: NEGATIVE
SSA (RO) (ENA) ANTIBODY, IGG: NEGATIVE
SSB (LA) (ENA) ANTIBODY, IGG: NEGATIVE
ds DNA Ab: 1 IU/mL

## 2017-01-09 LAB — VITAMIN D 25 HYDROXY (VIT D DEFICIENCY, FRACTURES): Vit D, 25-Hydroxy: 38 ng/mL (ref 30–100)

## 2017-01-09 LAB — C3 AND C4
C3 Complement: 144 mg/dL (ref 82–185)
C4 Complement: 34 mg/dL (ref 15–53)

## 2017-01-09 LAB — CARDIOLIPIN ANTIBODIES, IGG, IGM, IGA: Anticardiolipin IgM: <12 [MPL'U]

## 2017-01-09 LAB — CK: CK TOTAL: 32 U/L — AB (ref 44–196)

## 2017-01-09 NOTE — Progress Notes (Signed)
WNL, Should take Vit D OTC 1000 qd

## 2017-01-11 LAB — RFX PTT-LA W/RFX TO HEX PHASE CONF: PTT-LA SCREEN: 52 s — AB (ref <=40)

## 2017-01-11 LAB — LUPUS ANTICOAGULANT PANEL

## 2017-01-11 LAB — RFX DRVVT SCR W/RFLX CONF 1:1 MIX: dRVVT Screen: 44 s (ref <=45)

## 2017-01-11 LAB — RFLX HEXAGONAL PHASE CONFIRM: Hexagonal Phase Confirm: NEGATIVE

## 2017-01-12 LAB — BETA-2 GLYCOPROTEIN ANTIBODIES
Beta-2 Glyco I IgG: <9 SGU (ref <=20)
Beta-2-Glycoprotein I IgA: <9 SAU (ref <=20)
Beta-2-Glycoprotein I IgM: <9 SMU (ref <=20)

## 2017-01-12 LAB — CYCLIC CITRUL PEPTIDE ANTIBODY, IGG: Cyclic Citrullin Peptide Ab: <16 Units

## 2017-01-12 NOTE — Progress Notes (Signed)
RNP + , please sch [place] bilateral hands

## 2017-01-30 DIAGNOSIS — M19041 Primary osteoarthritis, right hand: Secondary | ICD-10-CM | POA: Insufficient documentation

## 2017-01-30 DIAGNOSIS — R5383 Other fatigue: Secondary | ICD-10-CM | POA: Insufficient documentation

## 2017-01-30 DIAGNOSIS — M19042 Primary osteoarthritis, left hand: Secondary | ICD-10-CM

## 2017-01-30 DIAGNOSIS — M19072 Primary osteoarthritis, left ankle and foot: Secondary | ICD-10-CM

## 2017-01-30 DIAGNOSIS — M19071 Primary osteoarthritis, right ankle and foot: Secondary | ICD-10-CM | POA: Insufficient documentation

## 2017-01-30 NOTE — Progress Notes (Deleted)
Office Visit Note  Patient: Jeffery Parks             Date of Birth: 1961-05-23           MRN: [id number]             PCP: Lauree Chandler, MD Referring: No ref. provider found Visit Date: 02/05/2017 Occupation: @GUAROCC @    Subjective:  No chief complaint on file.   History of Present Illness: Jeffery Parks is a 56 y.o. male ***   Activities of Daily Living:  Patient reports morning stiffness for *** {minute/hour:19697}.   Patient {ACTIONS;DENIES/REPORTS:21021675::"Denies"} nocturnal pain.  Difficulty dressing/grooming: {ACTIONS;DENIES/REPORTS:21021675::"Denies"} Difficulty climbing stairs: {ACTIONS;DENIES/REPORTS:21021675::"Denies"} Difficulty getting out of chair: {ACTIONS;DENIES/REPORTS:21021675::"Denies"} Difficulty using hands for taps, buttons, cutlery, and/or writing: {ACTIONS;DENIES/REPORTS:21021675::"Denies"}   No Rheumatology ROS completed.   PMFS History:  Patient Active Problem List   Diagnosis Date Noted  . Other fatigue 01/30/2017  . S/P arthroscopic surgery of right knee 01/08/2017  . ANA positive 01/06/2017  . Multiple joint pain 01/06/2017  . Smoker 01/06/2017  . History of COPD 01/06/2017  . History of skin cancer 01/06/2017  . DDD (degenerative disc disease), cervical 01/06/2017  . DDD (degenerative disc disease), lumbar 01/06/2017  . History of gastroesophageal reflux (GERD) 01/06/2017  . Synovitis of right knee 01/04/2017  . Acute medial meniscus tear, right, initial encounter 11/18/2016    Past Medical History:  Diagnosis Date  . COPD (chronic obstructive pulmonary disease) ([place])   . Hiatal hernia   . Skin cancer     No family history on file. Past Surgical History:  Procedure Laterality Date  . APPENDECTOMY    . KNEE ARTHROSCOPY    . ROTATOR CUFF REPAIR Right   . SKIN CANCER EXCISION     Social History   Social History Narrative  . No narrative on file     Objective: Vital Signs: There were no vitals taken for  this visit.   Physical Exam   Musculoskeletal Exam: ***  CDAI Exam: No CDAI exam completed.    Investigation: Findings:  01/08/2017 CK 32,ENA Panel shows RNP positive 1.5 otherwise normal,  ANA negative,  Beta-2 glycoprotein antibodies normal, C3 and C4 normal, Cardiolipin antibodies, IgG, IgM, IgA normal, Cyclic citrul peptide antibody, IgG normal, Lupus anticoagulant panel shows lupus anticoagulant not detected, rfx PTT-LA w/rfx to Hex Phase Conf 52 High,rflx hexagonal Phase Confirm negative, rfx dRVVT Scr w/rflx Conf 1:1 Mix normal,  Urinalysis normal, and VIitamin D 25 Hydroxy 38      Imaging: Xr Foot 2 Views Left  Result Date: 01/08/2017 Minimal PIP/DIP narrowing was noted. Minimal first MTP joint narrowing was noted. No other MTPs showed narrowing or erosive changes no intertarsal joint space narrowing was noted. Impression: Mild osteoarthritis of the foot  Xr Foot 2 Views Right  Result Date: 01/08/2017 Minimal PIP/DIP narrowing was noted. Minimal first MTP joint narrowing was noted. No other MTPs showed narrowing or erosive changes no intertarsal joint space narrowing was noted. Impression: Mild osteoarthritis of the foot  Xr Hand 2 View Left  Result Date: 01/08/2017 Minimal PIP/DIP narrowing was noted. No MCP joint changes or intercarpal joint changes were noted. No erosive changes were noted. He had subluxation of left first MCP joint and narrowing. 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 No MCP joint changes or intercarpal joint changes were noted. No erosive  changes were noted. Impression: Mild osteoarthritis of hand   Speciality Comments: No specialty comments available.    Procedures:  No procedures performed Allergies: Morphine; Gabapentin; Lyrica [pregabalin]; and Morphine and related   Assessment / Plan:     Visit  Diagnoses: ANA positive  Other fatigue  Multiple joint pain  DDD (degenerative disc disease), cervical  Synovitis of right knee  S/P arthroscopic surgery of right knee  DDD (degenerative disc disease), lumbar  Smoker  History of skin cancer  History of gastroesophageal reflux (GERD)  History of COPD    Orders: No orders of the defined types were placed in this encounter.  No orders of the defined types were placed in this encounter.   Face-to-face time spent with patient was *** minutes. 50% of time was spent in counseling and coordination of care.  Follow-Up Instructions: No Follow-up on file.   Amy Littrell, RT  Note - This record has been created using [employer].  Chart creation errors have been sought, but may not always  have been located. Such creation errors do not reflect on  the standard of medical care.

## 2017-02-05 ENCOUNTER — Ambulatory Visit: Payer: Self-pay | Admitting: Rheumatology

## 2017-04-24 NOTE — Progress Notes (Deleted)
Office Visit Note  Patient: Jeffery Parks             Date of Birth: 28-May-1961           MRN: [id number]             PCP: Lauree Chandler, MD Referring: Lauree Chandler, MD Visit Date: 04/29/2017 Occupation: @GUAROCC @    Subjective:  No chief complaint on file.   History of Present Illness: Jeffery Parks is a 56 y.o. male ***   Activities of Daily Living:  Patient reports morning stiffness for *** {minute/hour:19697}.   Patient {ACTIONS;DENIES/REPORTS:21021675::"Denies"} nocturnal pain.  Difficulty dressing/grooming: {ACTIONS;DENIES/REPORTS:21021675::"Denies"} Difficulty climbing stairs: {ACTIONS;DENIES/REPORTS:21021675::"Denies"} Difficulty getting out of chair: {ACTIONS;DENIES/REPORTS:21021675::"Denies"} Difficulty using hands for taps, buttons, cutlery, and/or writing: {ACTIONS;DENIES/REPORTS:21021675::"Denies"}   No Rheumatology ROS completed.   PMFS History:  Patient Active Problem List   Diagnosis Date Noted  . Other fatigue 01/30/2017  . Primary osteoarthritis of both hands 01/30/2017  . Primary osteoarthritis of both feet 01/30/2017  . S/P arthroscopic surgery of right knee 01/08/2017  . ANA positive 01/06/2017  . Multiple joint pain 01/06/2017  . Smoker 01/06/2017  . History of COPD 01/06/2017  . History of skin cancer 01/06/2017  . DDD (degenerative disc disease), cervical 01/06/2017  . DDD (degenerative disc disease), lumbar 01/06/2017  . History of gastroesophageal reflux (GERD) 01/06/2017  . Synovitis of right knee 01/04/2017  . Acute medial meniscus tear, right, initial encounter 11/18/2016    Past Medical History:  Diagnosis Date  . COPD (chronic obstructive pulmonary disease) ([place])   . Hiatal hernia   . Skin cancer     No family history on file. Past Surgical History:  Procedure Laterality Date  . APPENDECTOMY    . KNEE ARTHROSCOPY    . ROTATOR CUFF REPAIR Right   . SKIN CANCER EXCISION     Social History   Social History  Narrative  . No narrative on file     Objective: Vital Signs: There were no vitals taken for this visit.   Physical Exam   Musculoskeletal Exam: ***  CDAI Exam: No CDAI exam completed.    Investigation: No additional findings. CBC Latest Ref Rng & Units 01/08/2017 01/13/2012  WBC 3.8 - 10.8 K/uL 13.8(H) 18.0(H)  Hemoglobin 13.2 - 17.1 g/dL 14.8 17.1(H)  Hematocrit 38.5 - 50.0 % 45.7 48.3  Platelets 140 - 400 K/uL 385 250   CMP     Component Value Date/Time   NA 138 01/08/2017 1402   K 5.1 01/08/2017 1402   CL 100 01/08/2017 1402   CO2 20 01/08/2017 1402   GLUCOSE 85 01/08/2017 1402   BUN 10 01/08/2017 1402   CREATININE 0.76 01/08/2017 1402   CALCIUM 9.6 01/08/2017 1402   PROT 7.4 01/08/2017 1402   ALBUMIN 4.2 01/08/2017 1402   AST 12 01/08/2017 1402   ALT 6 (L) 01/08/2017 1402   ALKPHOS 91 01/08/2017 1402   BILITOT 0.4 01/08/2017 1402   GFRNONAA >89 01/08/2017 1402   GFRAA >89 01/08/2017 1402  UA negative, CK neg, Vit D 38 ENA RNPPositive( ds, Ro, La, Sm, Scl -70),C3, C4 normal, b2negative, aCLnegative,LAnegative Imaging: No results found.  Speciality Comments: No specialty comments available.    Procedures:  No procedures performed Allergies: Morphine; Gabapentin; Lyrica [pregabalin]; and Morphine and related   Assessment / Plan:     Visit Diagnoses: No diagnosis found.    Orders: No orders of the defined types were placed in this encounter.  No orders of  the defined types were placed in this encounter.   Face-to-face time spent with patient was *** minutes. 50% of time was spent in counseling and coordination of care.  Follow-Up Instructions: No Follow-up on file.   Bo Merino, MD  Note - This record has been created using [employer].  Chart creation errors have been sought, but may not always  have been located. Such creation errors do not reflect on  the standard of medical care.

## 2017-04-29 ENCOUNTER — Ambulatory Visit: Payer: Self-pay | Admitting: Rheumatology

## 2019-03-15 DEATH — deceased
